# Patient Record
Sex: Female | Born: 1950 | Race: White | Hispanic: No | Smoking: Current every day smoker
Health system: Southern US, Community
[De-identification: ages and names within clinical notes are randomized; demographics above are authoritative.]

## PROBLEM LIST (undated history)

## (undated) DIAGNOSIS — E079 Disorder of thyroid, unspecified: Secondary | ICD-10-CM

## (undated) DIAGNOSIS — E785 Hyperlipidemia, unspecified: Secondary | ICD-10-CM

## (undated) DIAGNOSIS — I10 Essential (primary) hypertension: Secondary | ICD-10-CM

## (undated) DIAGNOSIS — K219 Gastro-esophageal reflux disease without esophagitis: Secondary | ICD-10-CM

## (undated) DIAGNOSIS — J45909 Unspecified asthma, uncomplicated: Secondary | ICD-10-CM

## (undated) HISTORY — PX: ANKLE SURGERY: SHX546

## (undated) HISTORY — PX: KNEE SURGERY: SHX244

---

## 2001-12-05 ENCOUNTER — Emergency Department (HOSPITAL_COMMUNITY): Admission: EM | Admit: 2001-12-05 | Discharge: 2001-12-05 | Payer: Self-pay | Admitting: Emergency Medicine

## 2002-10-07 ENCOUNTER — Encounter: Payer: Self-pay | Admitting: Internal Medicine

## 2002-10-07 ENCOUNTER — Ambulatory Visit (HOSPITAL_COMMUNITY): Admission: RE | Admit: 2002-10-07 | Discharge: 2002-10-07 | Payer: Self-pay | Admitting: Internal Medicine

## 2003-03-18 ENCOUNTER — Encounter (HOSPITAL_COMMUNITY): Admission: RE | Admit: 2003-03-18 | Discharge: 2003-03-21 | Payer: Self-pay | Admitting: Orthopedic Surgery

## 2003-03-25 ENCOUNTER — Encounter (HOSPITAL_COMMUNITY): Admission: RE | Admit: 2003-03-25 | Discharge: 2003-04-24 | Payer: Self-pay | Admitting: Orthopedic Surgery

## 2003-04-30 ENCOUNTER — Encounter (HOSPITAL_COMMUNITY): Admission: RE | Admit: 2003-04-30 | Discharge: 2003-05-30 | Payer: Self-pay | Admitting: Orthopedic Surgery

## 2003-12-14 ENCOUNTER — Emergency Department (HOSPITAL_COMMUNITY): Admission: EM | Admit: 2003-12-14 | Discharge: 2003-12-14 | Payer: Self-pay | Admitting: Emergency Medicine

## 2004-02-10 ENCOUNTER — Ambulatory Visit: Payer: Self-pay | Admitting: Family Medicine

## 2004-03-10 ENCOUNTER — Ambulatory Visit (HOSPITAL_COMMUNITY): Admission: RE | Admit: 2004-03-10 | Discharge: 2004-03-10 | Payer: Self-pay | Admitting: Otolaryngology

## 2004-03-10 ENCOUNTER — Encounter (INDEPENDENT_AMBULATORY_CARE_PROVIDER_SITE_OTHER): Payer: Self-pay | Admitting: *Deleted

## 2004-03-26 ENCOUNTER — Ambulatory Visit: Payer: Self-pay | Admitting: Family Medicine

## 2004-06-10 ENCOUNTER — Ambulatory Visit: Payer: Self-pay | Admitting: Family Medicine

## 2004-07-14 ENCOUNTER — Ambulatory Visit: Payer: Self-pay | Admitting: Family Medicine

## 2004-08-09 ENCOUNTER — Ambulatory Visit: Payer: Self-pay | Admitting: Family Medicine

## 2004-10-06 ENCOUNTER — Ambulatory Visit: Payer: Self-pay | Admitting: Family Medicine

## 2004-11-09 ENCOUNTER — Ambulatory Visit: Payer: Self-pay | Admitting: Family Medicine

## 2004-12-30 ENCOUNTER — Ambulatory Visit: Payer: Self-pay | Admitting: Family Medicine

## 2005-02-09 ENCOUNTER — Ambulatory Visit: Payer: Self-pay | Admitting: Family Medicine

## 2005-02-18 ENCOUNTER — Ambulatory Visit (HOSPITAL_COMMUNITY): Admission: RE | Admit: 2005-02-18 | Discharge: 2005-02-18 | Payer: Self-pay | Admitting: Family Medicine

## 2005-03-09 ENCOUNTER — Ambulatory Visit: Payer: Self-pay | Admitting: Family Medicine

## 2005-05-04 ENCOUNTER — Ambulatory Visit: Payer: Self-pay | Admitting: Family Medicine

## 2005-10-03 ENCOUNTER — Ambulatory Visit: Payer: Self-pay | Admitting: Family Medicine

## 2006-01-13 ENCOUNTER — Ambulatory Visit: Payer: Self-pay | Admitting: Family Medicine

## 2006-02-01 ENCOUNTER — Ambulatory Visit: Payer: Self-pay | Admitting: Family Medicine

## 2006-02-01 LAB — CONVERTED CEMR LAB: Hgb A1c MFr Bld: 6.3 %

## 2006-02-15 ENCOUNTER — Ambulatory Visit: Payer: Self-pay | Admitting: Family Medicine

## 2006-02-15 LAB — CONVERTED CEMR LAB
RBC count: 4.77 10*6/uL
TSH: 3.501 microintl units/mL
WBC, blood: 6.7 10*3/uL

## 2006-02-17 ENCOUNTER — Encounter: Payer: Self-pay | Admitting: Family Medicine

## 2006-02-17 DIAGNOSIS — M545 Low back pain, unspecified: Secondary | ICD-10-CM | POA: Insufficient documentation

## 2006-02-17 DIAGNOSIS — J309 Allergic rhinitis, unspecified: Secondary | ICD-10-CM | POA: Insufficient documentation

## 2006-02-17 DIAGNOSIS — K219 Gastro-esophageal reflux disease without esophagitis: Secondary | ICD-10-CM | POA: Insufficient documentation

## 2006-02-17 DIAGNOSIS — F411 Generalized anxiety disorder: Secondary | ICD-10-CM | POA: Insufficient documentation

## 2006-02-17 DIAGNOSIS — F17200 Nicotine dependence, unspecified, uncomplicated: Secondary | ICD-10-CM | POA: Insufficient documentation

## 2006-02-17 DIAGNOSIS — I10 Essential (primary) hypertension: Secondary | ICD-10-CM | POA: Insufficient documentation

## 2006-02-17 DIAGNOSIS — N83209 Unspecified ovarian cyst, unspecified side: Secondary | ICD-10-CM | POA: Insufficient documentation

## 2006-02-17 DIAGNOSIS — R498 Other voice and resonance disorders: Secondary | ICD-10-CM | POA: Insufficient documentation

## 2006-02-17 DIAGNOSIS — J45909 Unspecified asthma, uncomplicated: Secondary | ICD-10-CM | POA: Insufficient documentation

## 2006-02-17 DIAGNOSIS — M129 Arthropathy, unspecified: Secondary | ICD-10-CM | POA: Insufficient documentation

## 2006-02-17 DIAGNOSIS — E669 Obesity, unspecified: Secondary | ICD-10-CM | POA: Insufficient documentation

## 2006-02-17 DIAGNOSIS — G43909 Migraine, unspecified, not intractable, without status migrainosus: Secondary | ICD-10-CM | POA: Insufficient documentation

## 2006-02-17 DIAGNOSIS — E785 Hyperlipidemia, unspecified: Secondary | ICD-10-CM | POA: Insufficient documentation

## 2006-02-17 DIAGNOSIS — E039 Hypothyroidism, unspecified: Secondary | ICD-10-CM | POA: Insufficient documentation

## 2006-03-01 ENCOUNTER — Ambulatory Visit: Payer: Self-pay | Admitting: Family Medicine

## 2006-03-20 ENCOUNTER — Ambulatory Visit: Payer: Self-pay | Admitting: Family Medicine

## 2006-04-19 ENCOUNTER — Ambulatory Visit: Payer: Self-pay | Admitting: Family Medicine

## 2006-05-18 ENCOUNTER — Ambulatory Visit: Payer: Self-pay | Admitting: Family Medicine

## 2006-05-18 DIAGNOSIS — J449 Chronic obstructive pulmonary disease, unspecified: Secondary | ICD-10-CM | POA: Insufficient documentation

## 2006-05-18 DIAGNOSIS — J4489 Other specified chronic obstructive pulmonary disease: Secondary | ICD-10-CM | POA: Insufficient documentation

## 2006-05-18 LAB — CONVERTED CEMR LAB
Cholesterol, target level: 200 mg/dL
Glucose, Bld: 141 mg/dL
HDL goal, serum: 40 mg/dL
LDL Goal: 100 mg/dL

## 2006-06-15 ENCOUNTER — Telehealth (INDEPENDENT_AMBULATORY_CARE_PROVIDER_SITE_OTHER): Payer: Self-pay | Admitting: Family Medicine

## 2006-06-20 ENCOUNTER — Encounter (INDEPENDENT_AMBULATORY_CARE_PROVIDER_SITE_OTHER): Payer: Self-pay | Admitting: Family Medicine

## 2006-06-29 ENCOUNTER — Ambulatory Visit: Payer: Self-pay | Admitting: Family Medicine

## 2006-06-30 ENCOUNTER — Encounter (INDEPENDENT_AMBULATORY_CARE_PROVIDER_SITE_OTHER): Payer: Self-pay | Admitting: Family Medicine

## 2006-07-10 ENCOUNTER — Ambulatory Visit: Payer: Self-pay | Admitting: Family Medicine

## 2006-07-10 DIAGNOSIS — H8110 Benign paroxysmal vertigo, unspecified ear: Secondary | ICD-10-CM | POA: Insufficient documentation

## 2006-07-14 ENCOUNTER — Encounter (INDEPENDENT_AMBULATORY_CARE_PROVIDER_SITE_OTHER): Payer: Self-pay | Admitting: Family Medicine

## 2006-07-20 ENCOUNTER — Encounter (INDEPENDENT_AMBULATORY_CARE_PROVIDER_SITE_OTHER): Payer: Self-pay | Admitting: Family Medicine

## 2006-08-07 ENCOUNTER — Ambulatory Visit: Payer: Self-pay | Admitting: Family Medicine

## 2006-08-07 DIAGNOSIS — E119 Type 2 diabetes mellitus without complications: Secondary | ICD-10-CM | POA: Insufficient documentation

## 2006-08-07 LAB — CONVERTED CEMR LAB
Glucose, Bld: 131 mg/dL
Hgb A1c MFr Bld: 6.5 %

## 2006-08-08 ENCOUNTER — Encounter (INDEPENDENT_AMBULATORY_CARE_PROVIDER_SITE_OTHER): Payer: Self-pay | Admitting: Family Medicine

## 2006-08-15 ENCOUNTER — Encounter (INDEPENDENT_AMBULATORY_CARE_PROVIDER_SITE_OTHER): Payer: Self-pay | Admitting: Family Medicine

## 2006-08-25 ENCOUNTER — Encounter (INDEPENDENT_AMBULATORY_CARE_PROVIDER_SITE_OTHER): Payer: Self-pay | Admitting: Family Medicine

## 2006-08-28 LAB — CONVERTED CEMR LAB
ALT: 27 units/L (ref 0–35)
AST: 20 units/L (ref 0–37)
Albumin: 4.4 g/dL (ref 3.5–5.2)
Alkaline Phosphatase: 81 units/L (ref 39–117)
BUN: 16 mg/dL (ref 6–23)
Basophils Absolute: 0 10*3/uL (ref 0.0–0.1)
Basophils Relative: 0 % (ref 0–1)
CO2: 22 meq/L (ref 19–32)
Calcium: 9.1 mg/dL (ref 8.4–10.5)
Chloride: 107 meq/L (ref 96–112)
Cholesterol: 171 mg/dL (ref 0–200)
Creatinine, Ser: 0.73 mg/dL (ref 0.40–1.20)
Eosinophils Absolute: 0.1 10*3/uL (ref 0.0–0.7)
Eosinophils Relative: 1 % (ref 0–5)
Glucose, Bld: 93 mg/dL (ref 70–99)
HCT: 40.5 % (ref 36.0–46.0)
HDL: 70 mg/dL (ref >39)
Hemoglobin: 12.9 g/dL (ref 12.0–15.0)
LDL Cholesterol: 81 mg/dL (ref 0–99)
Lymphocytes Relative: 45 % (ref 12–46)
Lymphs Abs: 3.4 10*3/uL — ABNORMAL HIGH (ref 0.7–3.3)
MCHC: 31.9 g/dL (ref 30.0–36.0)
MCV: 92.5 fL (ref 78.0–100.0)
Monocytes Absolute: 0.6 10*3/uL (ref 0.2–0.7)
Monocytes Relative: 7 % (ref 3–11)
Neutro Abs: 3.5 10*3/uL (ref 1.7–7.7)
Neutrophils Relative %: 47 % (ref 43–77)
Platelets: 284 10*3/uL (ref 150–400)
Potassium: 4.1 meq/L (ref 3.5–5.3)
RBC: 4.38 M/uL (ref 3.87–5.11)
RDW: 14 % (ref 11.5–14.0)
Sodium: 144 meq/L (ref 135–145)
TSH: 0.526 microintl units/mL (ref 0.350–5.50)
Total Bilirubin: 0.8 mg/dL (ref 0.3–1.2)
Total CHOL/HDL Ratio: 2.4
Total Protein: 7.2 g/dL (ref 6.0–8.3)
Triglycerides: 101 mg/dL (ref <150)
VLDL: 20 mg/dL (ref 0–40)
WBC: 7.6 10*3/uL (ref 4.0–10.5)

## 2006-09-05 ENCOUNTER — Ambulatory Visit: Payer: Self-pay | Admitting: Family Medicine

## 2006-09-06 ENCOUNTER — Encounter (INDEPENDENT_AMBULATORY_CARE_PROVIDER_SITE_OTHER): Payer: Self-pay | Admitting: Family Medicine

## 2006-09-11 ENCOUNTER — Telehealth (INDEPENDENT_AMBULATORY_CARE_PROVIDER_SITE_OTHER): Payer: Self-pay | Admitting: *Deleted

## 2006-09-11 ENCOUNTER — Ambulatory Visit: Payer: Self-pay | Admitting: Family Medicine

## 2006-09-11 ENCOUNTER — Ambulatory Visit (HOSPITAL_COMMUNITY): Admission: RE | Admit: 2006-09-11 | Discharge: 2006-09-11 | Payer: Self-pay | Admitting: Family Medicine

## 2006-09-12 LAB — CONVERTED CEMR LAB: Pap Smear: NORMAL

## 2006-09-14 ENCOUNTER — Encounter (INDEPENDENT_AMBULATORY_CARE_PROVIDER_SITE_OTHER): Payer: Self-pay | Admitting: Family Medicine

## 2006-10-13 ENCOUNTER — Encounter (INDEPENDENT_AMBULATORY_CARE_PROVIDER_SITE_OTHER): Payer: Self-pay | Admitting: Family Medicine

## 2006-10-17 ENCOUNTER — Ambulatory Visit: Payer: Self-pay | Admitting: Family Medicine

## 2006-10-17 DIAGNOSIS — L989 Disorder of the skin and subcutaneous tissue, unspecified: Secondary | ICD-10-CM | POA: Insufficient documentation

## 2006-11-08 ENCOUNTER — Ambulatory Visit (HOSPITAL_COMMUNITY): Admission: RE | Admit: 2006-11-08 | Discharge: 2006-11-08 | Payer: Self-pay | Admitting: Family Medicine

## 2006-11-08 ENCOUNTER — Telehealth (INDEPENDENT_AMBULATORY_CARE_PROVIDER_SITE_OTHER): Payer: Self-pay | Admitting: *Deleted

## 2006-11-14 ENCOUNTER — Ambulatory Visit: Payer: Self-pay | Admitting: Family Medicine

## 2006-11-14 ENCOUNTER — Telehealth (INDEPENDENT_AMBULATORY_CARE_PROVIDER_SITE_OTHER): Payer: Self-pay | Admitting: *Deleted

## 2006-11-14 DIAGNOSIS — R109 Unspecified abdominal pain: Secondary | ICD-10-CM | POA: Insufficient documentation

## 2006-11-14 LAB — CONVERTED CEMR LAB
Bilirubin Urine: NEGATIVE
Glucose, Bld: 168 mg/dL
Glucose, Urine, Semiquant: 100
Hgb A1c MFr Bld: 6.2 %
Ketones, urine, test strip: NEGATIVE
Nitrite: NEGATIVE
Protein, U semiquant: NEGATIVE
Specific Gravity, Urine: 1.02
Urobilinogen, UA: 0.2
WBC Urine, dipstick: NEGATIVE
pH: 6

## 2006-11-15 ENCOUNTER — Ambulatory Visit (HOSPITAL_COMMUNITY): Admission: RE | Admit: 2006-11-15 | Discharge: 2006-11-15 | Payer: Self-pay | Admitting: Family Medicine

## 2006-11-15 ENCOUNTER — Telehealth (INDEPENDENT_AMBULATORY_CARE_PROVIDER_SITE_OTHER): Payer: Self-pay | Admitting: *Deleted

## 2006-11-15 LAB — CONVERTED CEMR LAB
ALT: 36 units/L — ABNORMAL HIGH (ref 0–35)
AST: 27 units/L (ref 0–37)
Albumin: 4.3 g/dL (ref 3.5–5.2)
Alkaline Phosphatase: 77 units/L (ref 39–117)
Amylase: 29 units/L (ref 0–105)
BUN: 13 mg/dL (ref 6–23)
Basophils Absolute: 0 10*3/uL (ref 0.0–0.1)
Basophils Relative: 1 % (ref 0–1)
CO2: 23 meq/L (ref 19–32)
Calcium: 9.7 mg/dL (ref 8.4–10.5)
Chloride: 104 meq/L (ref 96–112)
Creatinine, Ser: 0.79 mg/dL (ref 0.40–1.20)
Creatinine, Urine: 63.4 mg/dL
Eosinophils Absolute: 0.1 10*3/uL (ref 0.0–0.7)
Eosinophils Relative: 1 % (ref 0–5)
Folate: 12.5 ng/mL
Glucose, Bld: 103 mg/dL — ABNORMAL HIGH (ref 70–99)
HCT: 41.8 % (ref 36.0–46.0)
Hemoglobin: 13.4 g/dL (ref 12.0–15.0)
Lipase: 12 units/L (ref 0–75)
Lymphocytes Relative: 40 % (ref 12–46)
Lymphs Abs: 2.7 10*3/uL (ref 0.7–3.3)
MCHC: 32.1 g/dL (ref 30.0–36.0)
MCV: 94.6 fL (ref 78.0–100.0)
Microalb Creat Ratio: 23.3 mg/g (ref 0.0–30.0)
Microalb, Ur: 1.48 mg/dL (ref 0.00–1.89)
Monocytes Absolute: 0.5 10*3/uL (ref 0.2–0.7)
Monocytes Relative: 8 % (ref 3–11)
Neutro Abs: 3.3 10*3/uL (ref 1.7–7.7)
Neutrophils Relative %: 50 % (ref 43–77)
Platelets: 309 10*3/uL (ref 150–400)
Potassium: 4 meq/L (ref 3.5–5.3)
RBC: 4.42 M/uL (ref 3.87–5.11)
RDW: 13.8 % (ref 11.5–14.0)
Sodium: 142 meq/L (ref 135–145)
TSH: 0.264 microintl units/mL — ABNORMAL LOW (ref 0.350–5.50)
Total Bilirubin: 0.9 mg/dL (ref 0.3–1.2)
Total Protein: 7.1 g/dL (ref 6.0–8.3)
Vitamin B-12: 289 pg/mL (ref 211–911)
WBC: 6.6 10*3/uL (ref 4.0–10.5)

## 2006-11-16 ENCOUNTER — Encounter (INDEPENDENT_AMBULATORY_CARE_PROVIDER_SITE_OTHER): Payer: Self-pay | Admitting: Family Medicine

## 2006-11-23 ENCOUNTER — Encounter (INDEPENDENT_AMBULATORY_CARE_PROVIDER_SITE_OTHER): Payer: Self-pay | Admitting: Family Medicine

## 2006-12-12 ENCOUNTER — Telehealth (INDEPENDENT_AMBULATORY_CARE_PROVIDER_SITE_OTHER): Payer: Self-pay | Admitting: Family Medicine

## 2006-12-12 ENCOUNTER — Encounter (INDEPENDENT_AMBULATORY_CARE_PROVIDER_SITE_OTHER): Payer: Self-pay | Admitting: Family Medicine

## 2006-12-15 ENCOUNTER — Encounter (INDEPENDENT_AMBULATORY_CARE_PROVIDER_SITE_OTHER): Payer: Self-pay | Admitting: Family Medicine

## 2006-12-17 ENCOUNTER — Encounter (INDEPENDENT_AMBULATORY_CARE_PROVIDER_SITE_OTHER): Payer: Self-pay | Admitting: Family Medicine

## 2006-12-21 ENCOUNTER — Encounter (INDEPENDENT_AMBULATORY_CARE_PROVIDER_SITE_OTHER): Payer: Self-pay | Admitting: Family Medicine

## 2007-05-15 ENCOUNTER — Encounter (HOSPITAL_COMMUNITY): Admission: RE | Admit: 2007-05-15 | Discharge: 2007-06-14 | Payer: Self-pay | Admitting: Family Medicine

## 2007-07-18 ENCOUNTER — Ambulatory Visit (HOSPITAL_BASED_OUTPATIENT_CLINIC_OR_DEPARTMENT_OTHER): Admission: RE | Admit: 2007-07-18 | Discharge: 2007-07-18 | Payer: Self-pay | Admitting: Orthopedic Surgery

## 2010-04-12 ENCOUNTER — Encounter: Payer: Self-pay | Admitting: Family Medicine

## 2010-08-03 NOTE — Op Note (Signed)
NAMECAILI, Christie Thompson                  ACCOUNT NO.:  [id number]   MEDICAL RECORD NO.:  [id number]          PATIENT TYPE:  AMB   LOCATION:  DSC                          FACILITY:  [facility]   PHYSICIAN:  Loreta Ave, M.D. DATE OF BIRTH:  07-22-1950   DATE OF PROCEDURE:  DATE OF DISCHARGE:                               OPERATIVE REPORT   PREOPERATIVE DIAGNOSIS:  Right carpal tunnel syndrome.   POSTOPERATIVE DIAGNOSIS:  Right carpal tunnel syndrome.   PROCEDURE:  Right carpal tunnel release.   SURGEON:  Loreta Ave, MD.   ASSISTANT:  Genene Churn. Barry Dienes, PA.   ANESTHESIA:  General.   BLOOD LOSS:  Minimal.   TOURNIQUET TIME:  30 minutes.   SPECIMENS:  None.   CULTURES:  None.   COMPLICATIONS:  None.   DRESSINGS:  Sterile compressive.   PROCEDURE:  The patient was brought to the operating room, placed on the  operating table in supine position.  After adequate anesthesia had been  obtained, tourniquet applied on the upper aspect of the right arm.  Prepped and draped in the usual sterile fashion.  Exsanguinated with  elevation and Esmarch, tourniquet inflated to 250 mmHg.  A small curved  incision over the carpal tunnel extending slightly ulnarward over the  distal wrist crease.  Skin and subcutaneous tissue was divided.  Retinaculum over the carpal tunnel incised under direct visualization  from the forearm fascia proximally and the palmar arch distally.  Moderate to marked constriction of the nerve with a lot of scar within  carpal tunnel.  After carpal tunnel release and aponeurotomy, I had a  nice decompression of the nerve throughout.  The hourglass constriction  and erythema in the nerve improved after that.  Digital branch and motor  branches identified, protected, and decompressed.  One of the branch was  a little bit more ulnarward to the digits.  After thorough decompression, the carpal tunnel assessed.  No other  findings appreciated.  Wound was irrigated.  Skin  closed with nylon.  Sterile compressive dressing applied.  Tourniquet deflated and removed.  Anesthesia reversed.  Brought to the recovery room.  Tolerated the  surgery well.  No complications.      Loreta Ave, M.D.  Electronically Signed     DFM/MEDQ  D:  07/18/2007  T:  07/19/2007  Job:  [id number]

## 2010-08-26 ENCOUNTER — Other Ambulatory Visit: Payer: Self-pay | Admitting: Gastroenterology

## 2010-12-14 LAB — BASIC METABOLIC PANEL
BUN: 13
CO2: 28
Calcium: 9.5
Chloride: 105
Creatinine, Ser: 0.83
GFR calc Af Amer: >60
GFR calc non Af Amer: >60
Glucose, Bld: 109 — ABNORMAL HIGH
Potassium: 5
Sodium: 140

## 2010-12-14 LAB — POCT HEMOGLOBIN-HEMACUE: Hemoglobin: 12.9

## 2011-12-02 ENCOUNTER — Emergency Department (HOSPITAL_COMMUNITY)
Admission: EM | Admit: 2011-12-02 | Discharge: 2011-12-02 | Disposition: A | Discharge status: Home / Self Care | Payer: Medicare Other | Attending: Emergency Medicine | Admitting: Emergency Medicine

## 2011-12-02 ENCOUNTER — Emergency Department (HOSPITAL_COMMUNITY): Payer: Medicare Other

## 2011-12-02 ENCOUNTER — Encounter (HOSPITAL_COMMUNITY): Payer: Self-pay | Admitting: Family Medicine

## 2011-12-02 DIAGNOSIS — K219 Gastro-esophageal reflux disease without esophagitis: Secondary | ICD-10-CM | POA: Insufficient documentation

## 2011-12-02 DIAGNOSIS — F17200 Nicotine dependence, unspecified, uncomplicated: Secondary | ICD-10-CM | POA: Insufficient documentation

## 2011-12-02 DIAGNOSIS — I10 Essential (primary) hypertension: Secondary | ICD-10-CM | POA: Insufficient documentation

## 2011-12-02 DIAGNOSIS — Z8249 Family history of ischemic heart disease and other diseases of the circulatory system: Secondary | ICD-10-CM | POA: Insufficient documentation

## 2011-12-02 DIAGNOSIS — IMO0002 Reserved for concepts with insufficient information to code with codable children: Secondary | ICD-10-CM | POA: Insufficient documentation

## 2011-12-02 DIAGNOSIS — J45909 Unspecified asthma, uncomplicated: Secondary | ICD-10-CM | POA: Insufficient documentation

## 2011-12-02 DIAGNOSIS — M5410 Radiculopathy, site unspecified: Secondary | ICD-10-CM

## 2011-12-02 DIAGNOSIS — Z888 Allergy status to other drugs, medicaments and biological substances status: Secondary | ICD-10-CM | POA: Insufficient documentation

## 2011-12-02 DIAGNOSIS — E119 Type 2 diabetes mellitus without complications: Secondary | ICD-10-CM | POA: Insufficient documentation

## 2011-12-02 DIAGNOSIS — Z833 Family history of diabetes mellitus: Secondary | ICD-10-CM | POA: Insufficient documentation

## 2011-12-02 HISTORY — DX: Hyperlipidemia, unspecified: E78.5

## 2011-12-02 HISTORY — DX: Gastro-esophageal reflux disease without esophagitis: K21.9

## 2011-12-02 HISTORY — DX: Unspecified asthma, uncomplicated: J45.909

## 2011-12-02 HISTORY — DX: Disorder of thyroid, unspecified: E07.9

## 2011-12-02 HISTORY — DX: Essential (primary) hypertension: I10

## 2011-12-02 MED ORDER — ONDANSETRON HCL 8 MG PO TABS: 8.0000 mg | ORAL_TABLET | ORAL | Status: AC | PRN

## 2011-12-02 MED ORDER — TRAMADOL HCL 50 MG PO TABS: 50.0000 mg | ORAL_TABLET | Freq: Four times a day (QID) | ORAL | Status: AC | PRN

## 2011-12-02 MED ORDER — PREDNISONE 50 MG PO TABS: 50.0000 mg | ORAL_TABLET | Freq: Every day | ORAL | Status: AC

## 2011-12-02 MED ORDER — HYDROMORPHONE HCL PF 2 MG/ML IJ SOLN
2.0000 mg | Freq: Once | INTRAMUSCULAR | Status: AC
  Administered 2011-12-02: 2 mg via INTRAMUSCULAR
  Filled 2011-12-02: qty 1

## 2011-12-02 MED ORDER — ONDANSETRON 8 MG PO TBDP
ORAL_TABLET | ORAL | Status: AC
  Filled 2011-12-02: qty 1

## 2011-12-02 MED ORDER — ONDANSETRON 8 MG PO TBDP
8.0000 mg | ORAL_TABLET | Freq: Once | ORAL | Status: AC
  Administered 2011-12-02: 8 mg via ORAL

## 2011-12-02 MED ORDER — CYCLOBENZAPRINE HCL 10 MG PO TABS: 10.0000 mg | ORAL_TABLET | Freq: Two times a day (BID) | ORAL | Status: AC | PRN

## 2011-12-02 NOTE — ED Notes (Addendum)
Pt states is extremely nauseated at this time.  EDP notified.

## 2011-12-02 NOTE — ED Notes (Signed)
Pt reports severe low back pain that started last night, has continued into today.  Denies any known injury. Denies any urinary s/s

## 2011-12-02 NOTE — ED Notes (Signed)
PT. C/o lower back pain radiating down right leg.  Pt. Reports pain began last night around midnight.  Pt. Denies recent trauma to area.

## 2011-12-02 NOTE — ED Notes (Signed)
Pt states she is ready to go home, RN is aware.

## 2011-12-02 NOTE — ED Provider Notes (Signed)
History  This chart was scribed for Donnetta Hutching, MD by Shari Heritage. The patient was seen in room APA08/APA08. Patient's care was started at 0935.     CSN: [id number]  Arrival date & time 12/02/11  0909   First MD Initiated Contact with Patient 12/02/11 0935      Chief Complaint  Patient presents with  . Back Pain    The history is provided by the patient. No language interpreter was used.   Christie Thompson is a 61 y.o. female who presents to the Emergency Department complaining of moderate to severe, constant, right-sided, lower back pain that radiates down her right leg onset 10 hours ago. Patient says that she tried to sleep last night in several different positions, but she could not get comfortable due to the pain. Patient says that she has had this pain intermittently since early 2013. Patient has a history asthma, HTN, thyroid disease, hyperlipidemia, diabetes, and GERD. Her surgical history includes ankle surgery and knee surgery. She is a current everyday smoker.  PCP - Cyndia Bent, Offices in [place], [place] and [place], [place]  Past Medical History  Diagnosis Date  . Asthma   . Hypertension   . Thyroid disease   . Hyperlipidemia   . Diabetes mellitus   . GERD (gastroesophageal reflux disease)     Past Surgical History  Procedure Date  . Ankle surgery   . Knee surgery     Family History  Problem Relation Age of Onset  . Diabetes Mother   . Hypertension Mother   . Hypertension Father     History  Substance Use Topics  . Smoking status: Current Every Day Smoker -- 1.0 packs/day    Types: Cigarettes  . Smokeless tobacco: Not on file  . Alcohol Use: No    OB History    Grav Para Term Preterm Abortions TAB SAB Ect Mult Living   5 4 4  1  1   4       Review of Systems A complete 10 system review of systems was obtained and all systems are negative except as noted in the HPI and PMH.   Allergies  Oxycodone hcl  Home Medications  No current outpatient  prescriptions on file.  BP 153/52  Pulse 75  Temp 98.2 F (36.8 C) (Oral)  Resp 18  Ht 4\' 11"  (1.499 m)  Wt 180 lb (81.647 kg)  BMI 36.36 kg/m2  SpO2 96%  Physical Exam  Nursing note and vitals reviewed. Constitutional: She is oriented to person, place, and time. She appears well-developed and well-nourished.  HENT:  Head: Normocephalic and atraumatic.  Eyes: Conjunctivae normal and EOM are normal. Pupils are equal, round, and reactive to light.  Neck: Normal range of motion. Neck supple.  Cardiovascular: Normal rate, regular rhythm and normal heart sounds.   Pulmonary/Chest: Effort normal and breath sounds normal.  Abdominal: Soft. Bowel sounds are normal.  Musculoskeletal: Normal range of motion.  Neurological: She is alert and oriented to person, place, and time.  Skin: Skin is warm and dry.  Psychiatric: She has a normal mood and affect.    ED Course  Procedures (including critical care time) DIAGNOSTIC STUDIES: Oxygen Saturation is 96% on room air, adequate by my interpretation.    COORDINATION OF CARE: 10:03am- Patient informed of current plan for treatment and evaluation and agrees with plan at this time. Will administer a shot of Dilaudid and order a lumbar/sacral X-ray.   Labs Reviewed - No data to display  Dg Lumbar Spine Complete  12/02/2011  *RADIOLOGY REPORT*  Clinical Data: Right lower back pain and radiation to the right leg.  LUMBAR SPINE - COMPLETE 4+ VIEW  Comparison: 02/05/2009  Findings: AP, lateral and oblique images of the lumbar spine were obtained.  Normal alignment of the lumbar spine.  Vertebral body heights are maintained.  Atherosclerotic calcifications in the abdominal aorta.  Again noted is mild disc space loss at L5-S1. Degenerative facet changes in the lower lumbar spine.  There may also be increased disc space loss at L4-L5 compared to the prior examination.  IMPRESSION: Evidence for disc disease in the lower lumbar spine along with facet  degenerative changes.  No acute bony abnormality.   Original Report Authenticated By: Richarda Overlie, M.D.      No diagnosis found.    MDM  Patient has long-standing low back pain and intermittent radicular pain to right leg. No bowel or bladder incontinence. Will need neurosurgical consultation at some point      I personally performed the services described in this documentation, which was scribed in my presence. The recorded information has been reviewed and considered.    Donnetta Hutching, MD 12/02/11 [phone]

## 2011-12-02 NOTE — ED Notes (Signed)
Ice water given

## 2012-12-05 ENCOUNTER — Other Ambulatory Visit: Payer: Self-pay | Admitting: Family Medicine

## 2012-12-05 DIAGNOSIS — Z1231 Encounter for screening mammogram for malignant neoplasm of breast: Secondary | ICD-10-CM

## 2012-12-25 ENCOUNTER — Ambulatory Visit: Payer: Medicare Other

## 2013-07-03 ENCOUNTER — Other Ambulatory Visit: Payer: Self-pay | Admitting: Gastroenterology

## 2013-07-03 ENCOUNTER — Other Ambulatory Visit: Payer: Self-pay | Admitting: Physician Assistant

## 2013-07-03 DIAGNOSIS — R160 Hepatomegaly, not elsewhere classified: Secondary | ICD-10-CM

## 2013-07-10 ENCOUNTER — Ambulatory Visit
Admission: RE | Admit: 2013-07-10 | Discharge: 2013-07-10 | Disposition: A | Discharge status: Home / Self Care | Payer: Commercial Managed Care - HMO | Source: Ambulatory Visit | Attending: Gastroenterology | Admitting: Gastroenterology

## 2013-07-10 DIAGNOSIS — R160 Hepatomegaly, not elsewhere classified: Secondary | ICD-10-CM

## 2013-08-22 IMAGING — CR DG LUMBAR SPINE COMPLETE 4+V
5 series · 5 of 5 positions shown · non-contrast
Comparison: 02/05/2009

CLINICAL DATA: Right lower back pain and radiation to the right
leg.

LUMBAR SPINE - COMPLETE 4+ VIEW

[view not recorded (1 of 5)]
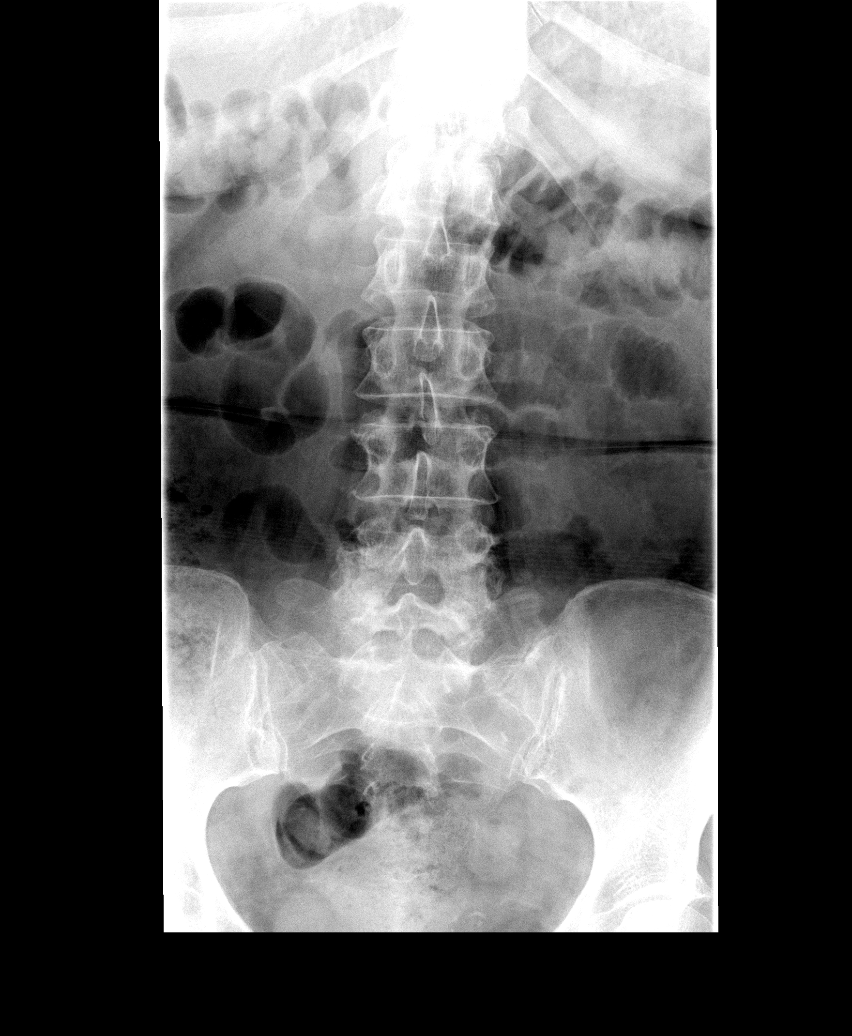

[view not recorded (2 of 5)]
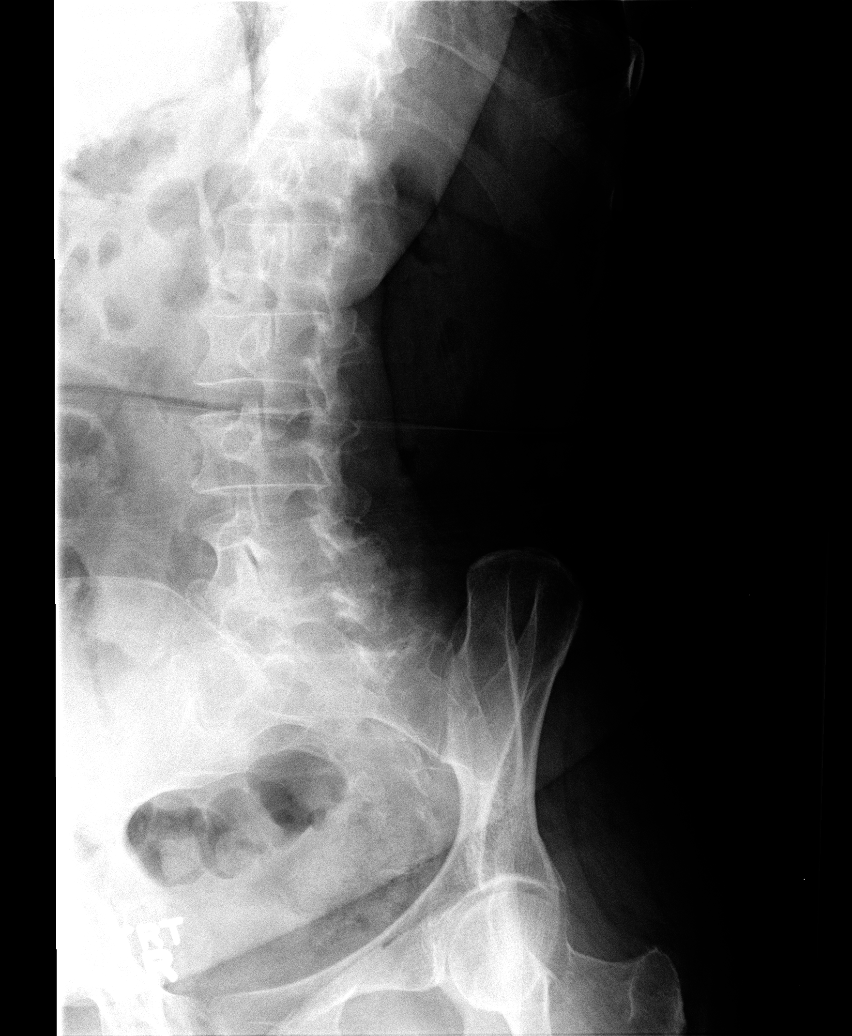

[view not recorded (3 of 5)]
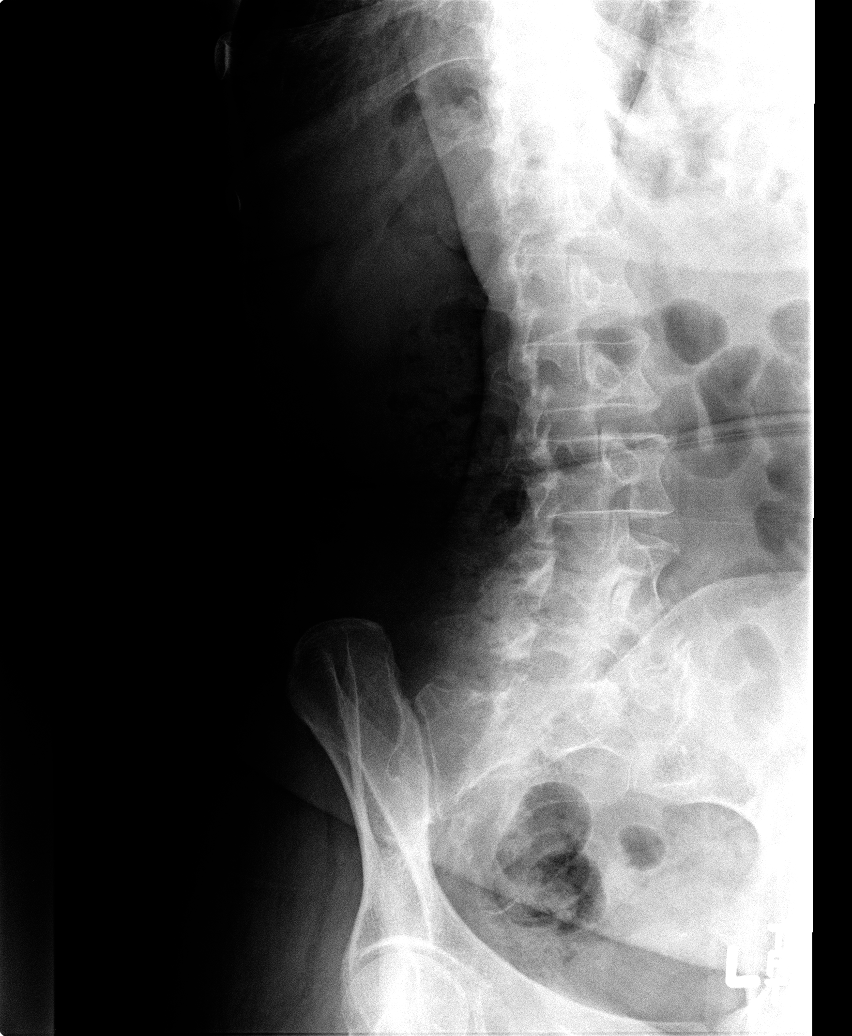

[view not recorded (4 of 5)]
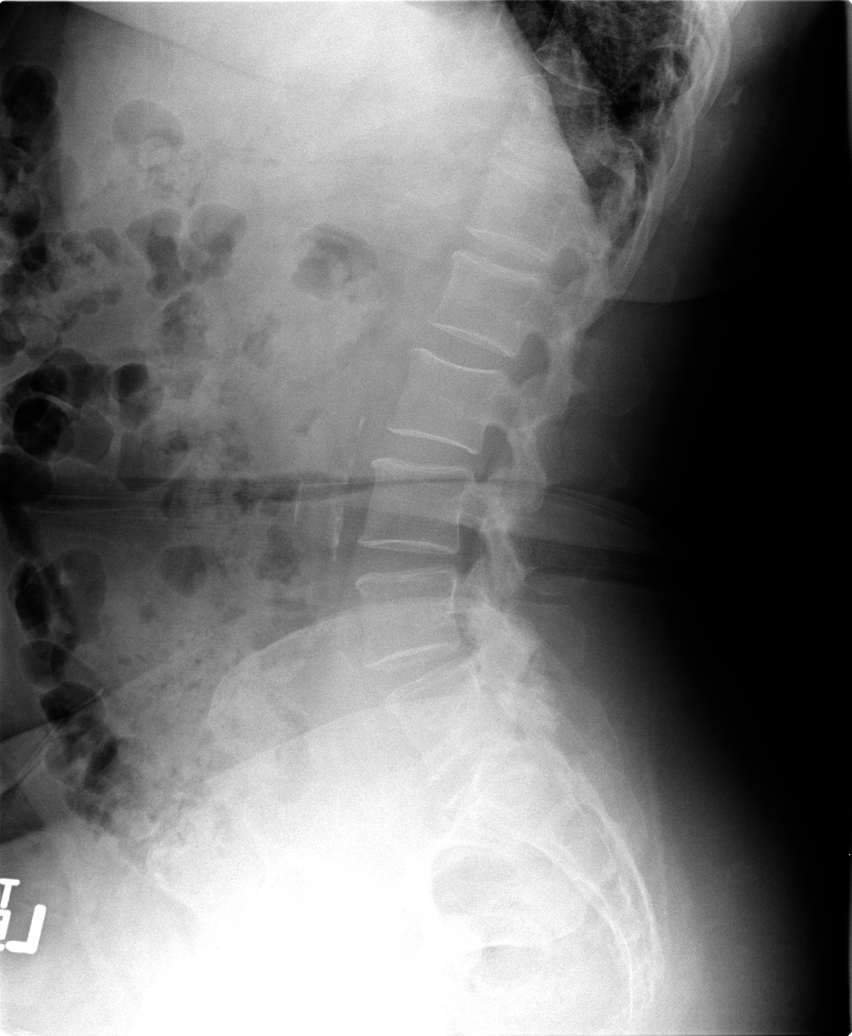

[view not recorded (5 of 5)]
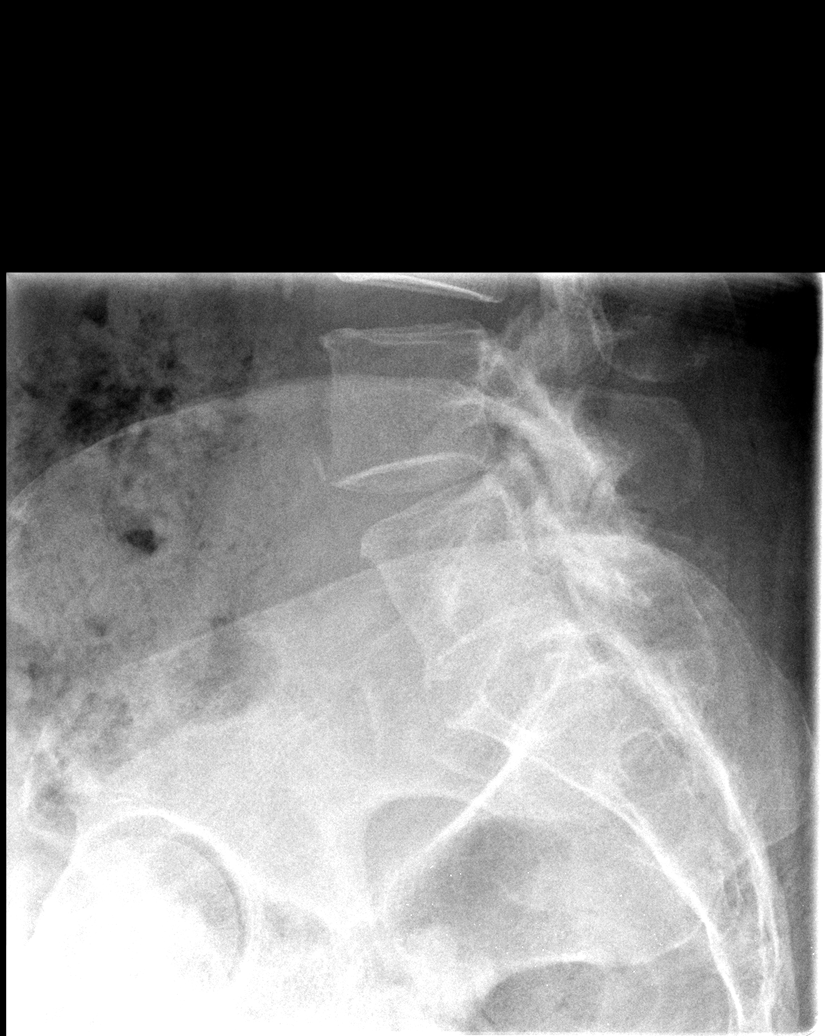

[5 of 5 positions shown; findings below may reference images not displayed]

FINDINGS: AP, lateral and oblique images of the lumbar spine were
obtained.  Normal alignment of the lumbar spine.  Vertebral body
heights are maintained.  Atherosclerotic calcifications in the
abdominal aorta.  Again noted is mild disc space loss at L5-S1.
Degenerative facet changes in the lower lumbar spine.  There may
also be increased disc space loss at L4-L5 compared to the prior
examination.
IMPRESSION: Evidence for disc disease in the lower lumbar spine along with
facet degenerative changes.

No acute bony abnormality.

## 2014-01-15 ENCOUNTER — Other Ambulatory Visit: Payer: Self-pay | Admitting: Physician Assistant

## 2014-01-15 DIAGNOSIS — K745 Biliary cirrhosis, unspecified: Secondary | ICD-10-CM

## 2014-01-23 ENCOUNTER — Other Ambulatory Visit: Payer: Commercial Managed Care - HMO

## 2014-07-23 ENCOUNTER — Other Ambulatory Visit: Payer: Self-pay | Admitting: Family Medicine

## 2014-07-23 DIAGNOSIS — Z1231 Encounter for screening mammogram for malignant neoplasm of breast: Secondary | ICD-10-CM

## 2014-08-21 ENCOUNTER — Ambulatory Visit
Admission: RE | Admit: 2014-08-21 | Discharge: 2014-08-21 | Disposition: A | Discharge status: Home / Self Care | Payer: Medicare HMO | Source: Ambulatory Visit | Attending: Family Medicine | Admitting: Family Medicine

## 2014-08-21 DIAGNOSIS — Z1231 Encounter for screening mammogram for malignant neoplasm of breast: Secondary | ICD-10-CM

## 2015-01-27 ENCOUNTER — Other Ambulatory Visit: Payer: Self-pay | Admitting: Podiatry

## 2016-02-01 ENCOUNTER — Other Ambulatory Visit: Payer: Self-pay | Admitting: Family Medicine

## 2016-02-01 DIAGNOSIS — Z1231 Encounter for screening mammogram for malignant neoplasm of breast: Secondary | ICD-10-CM

## 2018-01-17 ENCOUNTER — Encounter (HOSPITAL_BASED_OUTPATIENT_CLINIC_OR_DEPARTMENT_OTHER): Payer: Medicare HMO

## 2018-01-31 ENCOUNTER — Encounter (HOSPITAL_BASED_OUTPATIENT_CLINIC_OR_DEPARTMENT_OTHER): Payer: Medicare Other | Attending: Internal Medicine

## 2018-08-28 ENCOUNTER — Other Ambulatory Visit: Payer: Self-pay | Admitting: Family Medicine

## 2018-08-28 DIAGNOSIS — Z1231 Encounter for screening mammogram for malignant neoplasm of breast: Secondary | ICD-10-CM

## 2018-08-28 DIAGNOSIS — Z780 Asymptomatic menopausal state: Secondary | ICD-10-CM

## 2019-02-19 ENCOUNTER — Other Ambulatory Visit: Payer: Self-pay

## 2019-02-19 DIAGNOSIS — Z20822 Contact with and (suspected) exposure to covid-19: Secondary | ICD-10-CM

## 2019-02-22 ENCOUNTER — Telehealth: Payer: Self-pay | Admitting: Family Medicine

## 2019-02-22 LAB — NOVEL CORONAVIRUS, NAA: SARS-CoV-2, NAA: NOT DETECTED

## 2019-02-22 NOTE — Telephone Encounter (Signed)
Negative COVID results given. Patient results "NOT Detected." Caller expressed understanding. ° °

## 2020-03-17 ENCOUNTER — Emergency Department (HOSPITAL_COMMUNITY)
Admission: EM | Admit: 2020-03-17 | Discharge: 2020-03-17 | Disposition: A | Discharge status: Home / Self Care | Payer: Medicare Other | Attending: Emergency Medicine | Admitting: Emergency Medicine

## 2020-03-17 ENCOUNTER — Other Ambulatory Visit: Payer: Self-pay

## 2020-03-17 ENCOUNTER — Encounter (HOSPITAL_COMMUNITY): Payer: Self-pay | Admitting: *Deleted

## 2020-03-17 ENCOUNTER — Emergency Department (HOSPITAL_COMMUNITY): Payer: Medicare Other

## 2020-03-17 DIAGNOSIS — J45909 Unspecified asthma, uncomplicated: Secondary | ICD-10-CM | POA: Diagnosis not present

## 2020-03-17 DIAGNOSIS — F17210 Nicotine dependence, cigarettes, uncomplicated: Secondary | ICD-10-CM | POA: Diagnosis not present

## 2020-03-17 DIAGNOSIS — I10 Essential (primary) hypertension: Secondary | ICD-10-CM | POA: Diagnosis not present

## 2020-03-17 DIAGNOSIS — Z79899 Other long term (current) drug therapy: Secondary | ICD-10-CM | POA: Diagnosis not present

## 2020-03-17 DIAGNOSIS — E119 Type 2 diabetes mellitus without complications: Secondary | ICD-10-CM | POA: Insufficient documentation

## 2020-03-17 DIAGNOSIS — E039 Hypothyroidism, unspecified: Secondary | ICD-10-CM | POA: Diagnosis not present

## 2020-03-17 DIAGNOSIS — Z7984 Long term (current) use of oral hypoglycemic drugs: Secondary | ICD-10-CM | POA: Insufficient documentation

## 2020-03-17 DIAGNOSIS — R0602 Shortness of breath: Secondary | ICD-10-CM | POA: Diagnosis present

## 2020-03-17 DIAGNOSIS — U071 COVID-19: Secondary | ICD-10-CM | POA: Insufficient documentation

## 2020-03-17 DIAGNOSIS — J449 Chronic obstructive pulmonary disease, unspecified: Secondary | ICD-10-CM | POA: Insufficient documentation

## 2020-03-17 LAB — RESP PANEL BY RT-PCR (FLU A&B, COVID) ARPGX2
Influenza A by PCR: NEGATIVE
Influenza B by PCR: NEGATIVE
SARS Coronavirus 2 by RT PCR: POSITIVE — AB

## 2020-03-17 LAB — CBG MONITORING, ED: Glucose-Capillary: 94 mg/dL (ref 70–99)

## 2020-03-17 MED ORDER — ALBUTEROL SULFATE HFA 108 (90 BASE) MCG/ACT IN AERS
3.0000 | INHALATION_SPRAY | RESPIRATORY_TRACT | Status: DC | PRN
  Administered 2020-03-17: 3 via RESPIRATORY_TRACT
  Filled 2020-03-17: qty 6.7

## 2020-03-17 MED ORDER — PREDNISONE 20 MG PO TABS
40.0000 mg | ORAL_TABLET | Freq: Once | ORAL | Status: AC
Start: 2020-03-17 — End: 2020-03-17
  Administered 2020-03-17: 40 mg via ORAL
  Filled 2020-03-17: qty 2

## 2020-03-17 MED ORDER — PREDNISONE 20 MG PO TABS: 40.0000 mg | ORAL_TABLET | Freq: Every day | ORAL | 0 refills | Status: AC

## 2020-03-17 MED ORDER — AEROCHAMBER Z-STAT PLUS/MEDIUM MISC
1.0000 | Freq: Once | Status: AC
  Administered 2020-03-17: 1

## 2020-03-17 NOTE — ED Triage Notes (Signed)
Fever with body aches 

## 2020-03-17 NOTE — ED Notes (Signed)
Pt ambulated in hallway. Oxygen stayed at 98%

## 2020-03-17 NOTE — ED Provider Notes (Signed)
[facility] EMERGENCY DEPARTMENT Provider Note   CSN: [id number] Arrival date & time: 03/17/20  2440     History Chief Complaint  Patient presents with  . Covid Exposure    Christie Thompson is a 69 y.o. female presenting for evaluation of suspicion of Covid 42.  Her son with whom she lives (who is also here for evaluation) was exposed to a coworker 5 days ago with Covid, [name] started to develop symptoms of non productive cough, subjective fever, wheezing, shortness of breath and generalized body aches 2 days ago.  She has history of asthma, DM, HTN.  She is not covid 19 vaccinated. She denies n/v/d, chest pain or abdominal pain and has no anosmia.  She has been using her albuterol with appropriate but transient response.   HPI     Past Medical History:  Diagnosis Date  . Asthma   . Diabetes mellitus   . GERD (gastroesophageal reflux disease)   . Hyperlipidemia   . Hypertension   . Thyroid disease     Patient Active Problem List   Diagnosis Date Noted  . FLANK PAIN, LEFT 11/14/2006  . SKIN LESION 10/17/2006  . DIABETES MELLITUS, TYPE II, CONTROLLED 08/07/2006  . BENIGN POSITIONAL VERTIGO 07/10/2006  . COPD 05/18/2006  . HYPOTHYROIDISM 02/17/2006  . HYPERLIPIDEMIA 02/17/2006  . OBESITY 02/17/2006  . ANXIETY 02/17/2006  . SMOKER 02/17/2006  . MIGRAINE HEADACHE 02/17/2006  . HYPERTENSION 02/17/2006  . ALLERGIC RHINITIS 02/17/2006  . ASTHMA 02/17/2006  . GERD 02/17/2006  . OVARIAN CYST 02/17/2006  . ARTHRITIS 02/17/2006  . LOW BACK PAIN 02/17/2006  . HOARSENESS 02/17/2006    Past Surgical History:  Procedure Laterality Date  . ANKLE SURGERY    . KNEE SURGERY       OB History   No obstetric history on file.     Family History  Problem Relation Age of Onset  . Diabetes Mother   . Hypertension Mother   . Hypertension Father     Social History   Tobacco Use  . Smoking status: Current Every Day Smoker    Packs/day: 1.00    Types: Cigarettes  .  Smokeless tobacco: Never Used  Substance Use Topics  . Alcohol use: No  . Drug use: No    Home Medications Prior to Admission medications   Medication Sig Start Date End Date Taking? Authorizing Provider  predniSONE (DELTASONE) 20 MG tablet Take 2 tablets (40 mg total) by mouth daily for 4 days. 03/18/20 03/22/20 Yes Edric Fetterman, Raynelle Fanning, PA-C  albuterol (PROVENTIL HFA;VENTOLIN HFA) 108 (90 BASE) MCG/ACT inhaler Inhale 2 puffs into the lungs every 6 (six) hours as needed. For shortness of breath    [provider]  Alcaftadine (LASTACAFT) 0.25 % SOLN Place 1 drop into both eyes daily.    [provider]  ALPRAZolam Prudy Feeler) 1 MG tablet Take 1 mg by mouth 3 (three) times daily as needed. For nerves/anxiety    [provider]  amLODipine (NORVASC) 5 MG tablet Take 5 mg by mouth daily.    [provider]  atorvastatin (LIPITOR) 40 MG tablet Take 40 mg by mouth daily.    [provider]  citalopram (CELEXA) 40 MG tablet Take 40 mg by mouth daily.    [provider]  cycloSPORINE (RESTASIS) 0.05 % ophthalmic emulsion Place 1 drop into both eyes daily.    [provider]  diclofenac (VOLTAREN) 75 MG EC tablet Take 75 mg by mouth daily.  [provider]  furosemide (LASIX) 20 MG tablet Take 20 mg by mouth daily.    [provider]  HYDROcodone-acetaminophen (LORTAB) 10-500 MG per tablet Take 1 tablet by mouth every 4 (four) hours as needed. For pain    [provider]  levothyroxine (SYNTHROID, LEVOTHROID) 112 MCG tablet Take 112 mcg by mouth daily.    [provider]  meclizine (ANTIVERT) 25 MG tablet Take 25 mg by mouth 3 (three) times daily.    [provider]  olmesartan (BENICAR) 40 MG tablet Take 40 mg by mouth daily.    [provider]  omeprazole (PRILOSEC) 40 MG capsule Take 40 mg by mouth daily.    [provider]  pioglitazone (ACTOS) 15 MG tablet Take 15 mg by mouth daily.     [provider]  tiotropium (SPIRIVA) 18 MCG inhalation capsule Place 18 mcg into inhaler and inhale daily.    [provider]  zolpidem (AMBIEN) 5 MG tablet Take 5 mg by mouth at bedtime.    [provider]    Allergies    Oxycodone hcl  Review of Systems   Review of Systems  Constitutional: Positive for fever. Negative for chills.  HENT: Negative for congestion, ear pain, rhinorrhea, sinus pressure, sore throat, trouble swallowing and voice change.   Eyes: Negative for discharge.  Respiratory: Positive for cough, shortness of breath and wheezing. Negative for stridor.   Cardiovascular: Negative for chest pain.  Gastrointestinal: Negative for abdominal pain, diarrhea, nausea and vomiting.  Genitourinary: Negative.   Musculoskeletal: Positive for myalgias.  Skin: Negative.   All other systems reviewed and are negative.   Physical Exam Updated Vital Signs BP (!) 192/91 (BP Location: Left Arm)   Pulse 68   Temp 98.6 F (37 C) (Oral)   Resp 20   SpO2 96%   Physical Exam Vitals and nursing note reviewed.  Constitutional:      Appearance: She is well-developed and well-nourished.  HENT:     Head: Normocephalic and atraumatic.     Mouth/Throat:     Mouth: Mucous membranes are moist.  Eyes:     Conjunctiva/sclera: Conjunctivae normal.  Cardiovascular:     Rate and Rhythm: Normal rate and regular rhythm.     Pulses: Intact distal pulses.     Heart sounds: Normal heart sounds.  Pulmonary:     Effort: Pulmonary effort is normal.     Breath sounds: Wheezing present. No rhonchi or rales.     Comments: Expiratory wheeze with prolonged expirations bilaterally. No increased work of breathing. Musculoskeletal:        General: Normal range of motion.     Cervical back: Normal range of motion.  Skin:    General: Skin is warm and dry.  Neurological:     General: No focal deficit present.     Mental Status: She is alert.  Psychiatric:        Mood and  Affect: Mood and affect normal.     ED Results / Procedures / Treatments   Labs (all labs ordered are listed, but only abnormal results are displayed) Labs Reviewed  RESP PANEL BY RT-PCR (FLU A&B, COVID) ARPGX2 - Abnormal; Notable for the following components:      Result Value   SARS Coronavirus 2 by RT PCR POSITIVE (*)    All other components within normal limits  CBG MONITORING, ED    EKG None  Radiology DG Chest Portable 1 View  Result Date: 03/17/2020  CLINICAL DATA:  Fever, body aches and wheezing.  COVID positive. EXAM: PORTABLE CHEST 1 VIEW COMPARISON:  Radiographs 03/08/2004. FINDINGS: 1214 hours. The heart is mildly enlarged. There is mild aortic atherosclerosis. The pulmonary vascularity is normal. There is no edema, airspace disease, pleural effusion or pneumothorax. Mild degenerative changes are present throughout the spine. IMPRESSION: No active cardiopulmonary process. Mild cardiomegaly. Electronically Signed   By: Carey Bullocks M.D.   On: 03/17/2020 12:26    Procedures Procedures (including critical care time)  Medications Ordered in ED Medications  albuterol (VENTOLIN HFA) 108 (90 Base) MCG/ACT inhaler 3 puff (3 puffs Inhalation Given 03/17/20 1259)  predniSONE (DELTASONE) tablet 40 mg (40 mg Oral Given 03/17/20 1259)  aerochamber Z-Stat Plus/medium 1 each (1 each Other Given 03/17/20 1259)    ED Course  I have reviewed the triage vital signs and the nursing notes.  Pertinent labs & imaging results that were available during my care of the patient were reviewed by me and considered in my medical decision making (see chart for details).    MDM Rules/Calculators/A&P                         Pt given albuterol mdi tx here, also started on prednisone.  Cxr clear.  Pt ambulated without desaturation.  Stable for dc home. Contacted [facility] infusion center who will contact pt to discuss tx.  Discussed with pt who is desirous of this tx.   Strict return precautions  outlined.  Advised to keep close watch on cbg's while on prednisone.  REGINAE WOLFREY was evaluated in Emergency Department on 03/17/2020 for the symptoms described in the history of present illness. She was evaluated in the context of the global COVID-19 pandemic, which necessitated consideration that the patient might be at risk for infection with the SARS-CoV-2 virus that causes COVID-19. Institutional protocols and algorithms that pertain to the evaluation of patients at risk for COVID-19 are in a state of rapid change based on information released by regulatory bodies including the CDC and federal and state organizations. These policies and algorithms were followed during the patient's care in the ED.  Final Clinical Impression(s) / ED Diagnoses Final diagnoses:  COVID-19    Rx / DC Orders ED Discharge Orders         Ordered    predniSONE (DELTASONE) 20 MG tablet  Daily        03/17/20 1311           Burgess Amor, PA-C 03/17/20 1401    Pollyann Savoy, MD 03/18/20 [phone]

## 2020-03-17 NOTE — Discharge Instructions (Signed)
Take your next dose of prednisone tomorrow morning.  Rest and make sure you are drinking plenty of fluids.  Use your inhaler as needed for wheezing or shortness of breath.  It will be important to keep a close watch on your blood glucose levels while on prednisone - call your MD if they go over 200.    The monoclonal antibody infusion clinic with [facility] will be contacting you for additional screening within the next 48 hours to determine if you are a candidate for this treatment.  Expect their call.  In the interim return here if you develop any worsening symptoms (shortness of breath or weakness).

## 2020-03-18 ENCOUNTER — Other Ambulatory Visit: Payer: Self-pay | Admitting: Infectious Diseases

## 2020-03-18 ENCOUNTER — Telehealth: Payer: Self-pay | Admitting: Infectious Diseases

## 2020-03-18 DIAGNOSIS — E669 Obesity, unspecified: Secondary | ICD-10-CM

## 2020-03-18 DIAGNOSIS — U071 COVID-19: Secondary | ICD-10-CM

## 2020-03-18 DIAGNOSIS — E119 Type 2 diabetes mellitus without complications: Secondary | ICD-10-CM

## 2020-03-18 NOTE — Telephone Encounter (Signed)
Called to Discuss with patient about Covid symptoms and the use of the monoclonal antibody infusion for those with mild to moderate Covid symptoms and at a high risk of hospitalization.     Pt appears to qualify for this infusion due to co-morbid conditions and/or a member of an at-risk group in accordance with the FDA Emergency Use Authorization.   Symptom onset: 03/15/20 Vaccinated: No  Qualified for Infusion:  Multiple co-morbidities   Scheduled for 12/30    Rexene Alberts, MSN, NP-C [facility] for Infectious Disease [facility]  [email or web address] Pager: [phone] Office: [phone] [facility] Main Line: [phone]

## 2020-03-18 NOTE — Progress Notes (Signed)
I connected by phone with Christie Thompson on 03/18/2020 at 3:19 PM to discuss the potential use of a new treatment for mild to moderate COVID-19 viral infection in non-hospitalized patients.  This patient is a 69 y.o. female that meets the FDA criteria for Emergency Use Authorization of COVID monoclonal antibody casirivimab/imdevimab, bamlanivimab/etesevimab, or sotrovimab.  Has a (+) direct SARS-CoV-2 viral test result  Has mild or moderate COVID-19   Is NOT hospitalized due to COVID-19  Is within 10 days of symptom onset  Has at least one of the high risk factor(s) for progression to severe COVID-19 and/or hospitalization as defined in EUA.  Specific high risk criteria : Older age (>/= 68 yo), Chronic Kidney Disease (CKD), Diabetes and Chronic Lung Disease   I have spoken and communicated the following to the patient or parent/caregiver regarding COVID monoclonal antibody treatment:  1. FDA has authorized the emergency use for the treatment of mild to moderate COVID-19 in adults and pediatric patients with positive results of direct SARS-CoV-2 viral testing who are 44 years of age and older weighing at least 40 kg, and who are at high risk for progressing to severe COVID-19 and/or hospitalization.  2. The significant known and potential risks and benefits of COVID monoclonal antibody, and the extent to which such potential risks and benefits are unknown.  3. Information on available alternative treatments and the risks and benefits of those alternatives, including clinical trials.  4. Patients treated with COVID monoclonal antibody should continue to self-isolate and use infection control measures (e.g., wear mask, isolate, social distance, avoid sharing personal items, clean and disinfect high touch surfaces, and frequent handwashing) according to CDC guidelines.   5. The patient or parent/caregiver has the option to accept or refuse COVID monoclonal antibody treatment.  After  reviewing this information with the patient, the patient has agreed to receive one of the available covid 19 monoclonal antibodies and will be provided an appropriate fact sheet prior to infusion.   Rexene Alberts, NP 03/18/2020 3:19 PM

## 2020-03-19 ENCOUNTER — Ambulatory Visit (HOSPITAL_COMMUNITY): Payer: Medicare Other

## 2020-09-08 ENCOUNTER — Other Ambulatory Visit: Payer: Self-pay | Admitting: Family Medicine

## 2020-09-08 DIAGNOSIS — Z1231 Encounter for screening mammogram for malignant neoplasm of breast: Secondary | ICD-10-CM

## 2020-09-08 DIAGNOSIS — Z780 Asymptomatic menopausal state: Secondary | ICD-10-CM

## 2022-09-02 ENCOUNTER — Other Ambulatory Visit (HOSPITAL_COMMUNITY): Payer: Self-pay | Admitting: Physician Assistant

## 2022-09-02 DIAGNOSIS — Z720 Tobacco use: Secondary | ICD-10-CM

## 2022-09-02 DIAGNOSIS — J449 Chronic obstructive pulmonary disease, unspecified: Secondary | ICD-10-CM

## 2022-09-02 DIAGNOSIS — Z1231 Encounter for screening mammogram for malignant neoplasm of breast: Secondary | ICD-10-CM

## 2022-09-02 DIAGNOSIS — J709 Respiratory conditions due to unspecified external agent: Secondary | ICD-10-CM

## 2022-09-02 DIAGNOSIS — Z780 Asymptomatic menopausal state: Secondary | ICD-10-CM

## 2022-09-12 ENCOUNTER — Other Ambulatory Visit (HOSPITAL_COMMUNITY): Payer: Medicare Other

## 2022-09-12 ENCOUNTER — Ambulatory Visit (HOSPITAL_COMMUNITY): Payer: Medicare Other

## 2022-10-20 ENCOUNTER — Ambulatory Visit (HOSPITAL_COMMUNITY)
Admission: RE | Admit: 2022-10-20 | Discharge: 2022-10-20 | Disposition: A | Discharge status: Home / Self Care | Payer: Medicare Other | Source: Ambulatory Visit | Attending: Physician Assistant | Admitting: Physician Assistant

## 2022-10-20 DIAGNOSIS — J709 Respiratory conditions due to unspecified external agent: Secondary | ICD-10-CM | POA: Insufficient documentation

## 2022-10-20 DIAGNOSIS — Z720 Tobacco use: Secondary | ICD-10-CM | POA: Diagnosis present

## 2022-10-20 DIAGNOSIS — J449 Chronic obstructive pulmonary disease, unspecified: Secondary | ICD-10-CM | POA: Insufficient documentation
